# Patient Record
Sex: Male | Born: 1975 | Race: White | Hispanic: No | Marital: Married | Smoking: Never smoker
Health system: Southern US, Community
[De-identification: ages and names within clinical notes are randomized; demographics above are authoritative.]

## PROBLEM LIST (undated history)

## (undated) DIAGNOSIS — I499 Cardiac arrhythmia, unspecified: Secondary | ICD-10-CM

## (undated) DIAGNOSIS — I10 Essential (primary) hypertension: Secondary | ICD-10-CM

## (undated) DIAGNOSIS — I4510 Unspecified right bundle-branch block: Secondary | ICD-10-CM

---

## 2005-09-28 ENCOUNTER — Encounter: Payer: Self-pay | Admitting: Cardiovascular Disease

## 2005-09-28 ENCOUNTER — Ambulatory Visit: Payer: Self-pay

## 2007-06-28 ENCOUNTER — Ambulatory Visit (HOSPITAL_COMMUNITY): Admission: RE | Admit: 2007-06-28 | Discharge: 2007-06-28 | Payer: Self-pay | Admitting: Internal Medicine

## 2007-07-01 ENCOUNTER — Emergency Department: Payer: Self-pay | Admitting: Emergency Medicine

## 2007-07-25 ENCOUNTER — Ambulatory Visit: Payer: Self-pay | Admitting: Orthopaedic Surgery

## 2009-01-26 IMAGING — US US SOFT TISSUE EXCLUDE HEAD/NECK
1 series · 18 of 25 positions shown · non-contrast
Comparison: none

REASON FOR EXAM: ? mass on lateral thigh
COMMENTS:

PROCEDURE:     US  - US SOFT TISSUE, NOT NECK /  HEAD  - July 01, 2007  [DATE]
RESULT:     No cystic or solid masses are noted.

[Series 1: us soft tissue exclude head/neck · 18 of 37 slices shown]
[im 1/37]
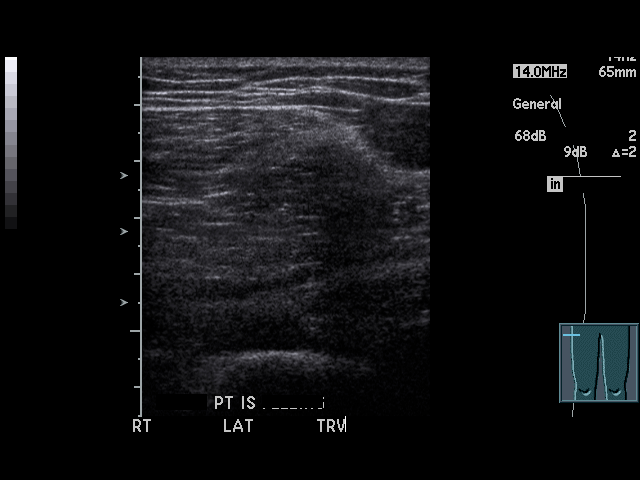
[im 4/37]
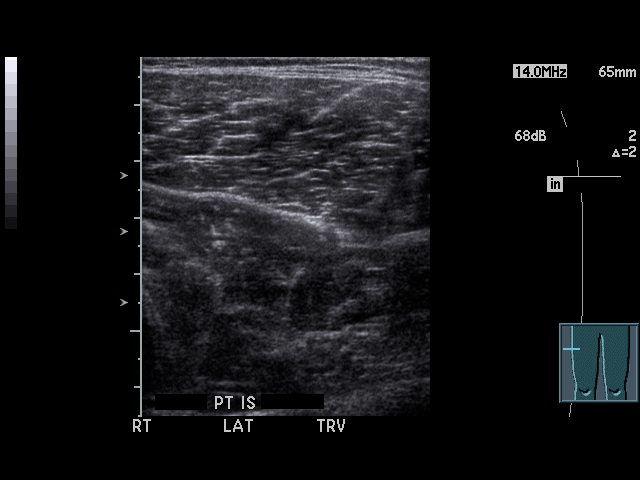
[im 5/37]
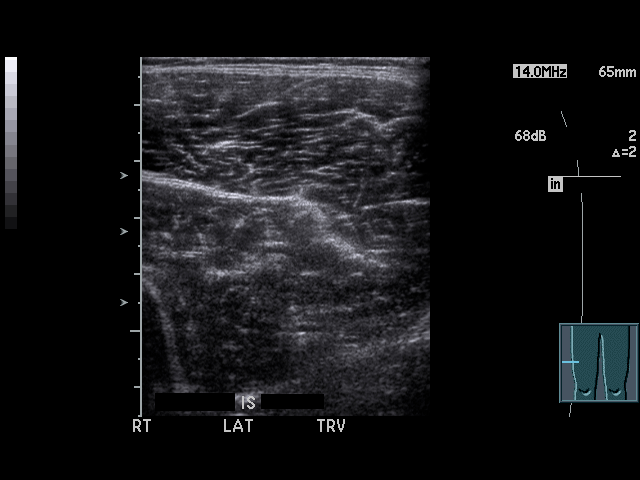
[im 7/37]
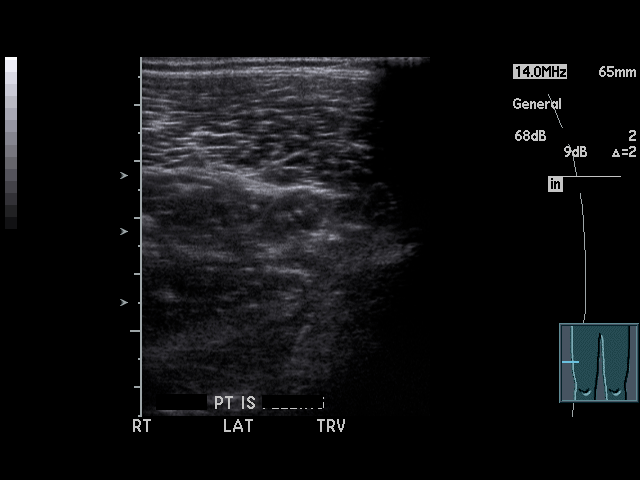
[im 10/37]
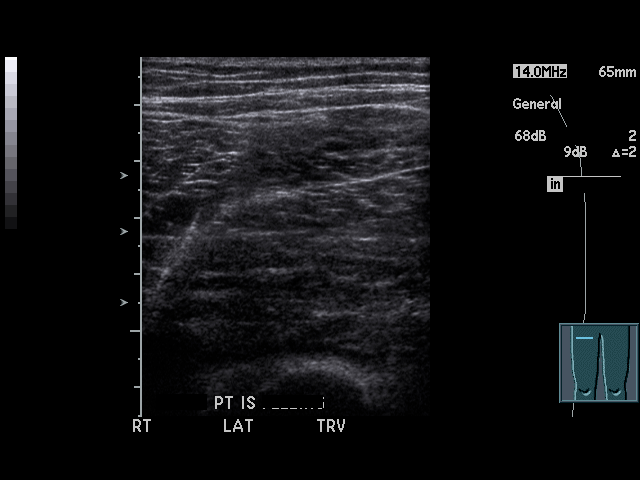
[im 11/37]
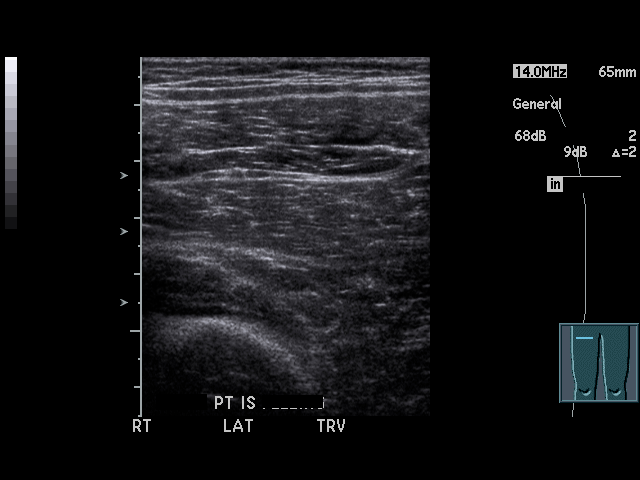
[im 14/37]
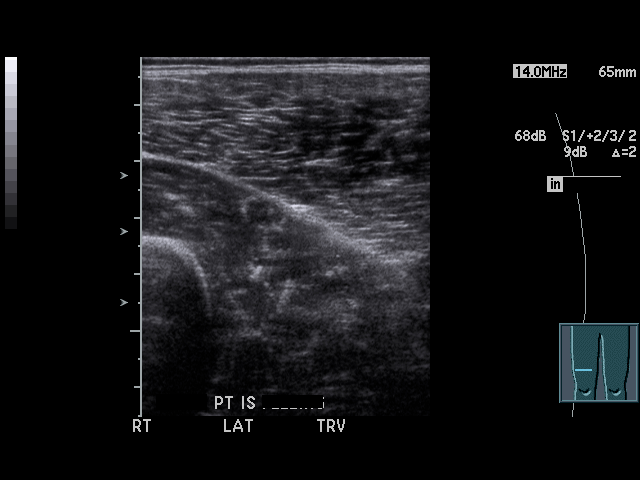
[im 16/37]
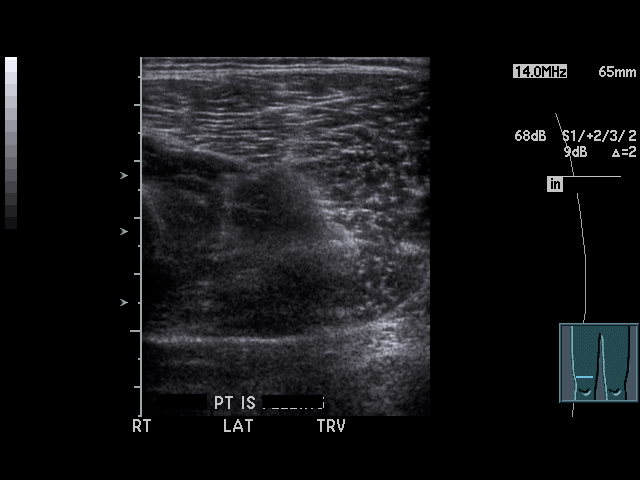
[im 17/37]
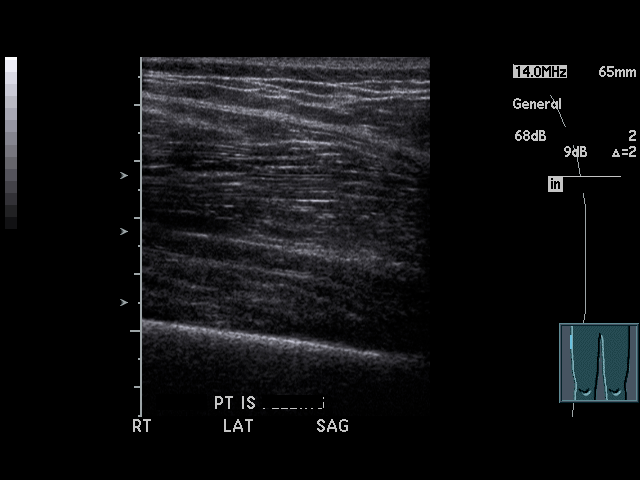
[im 20/37]
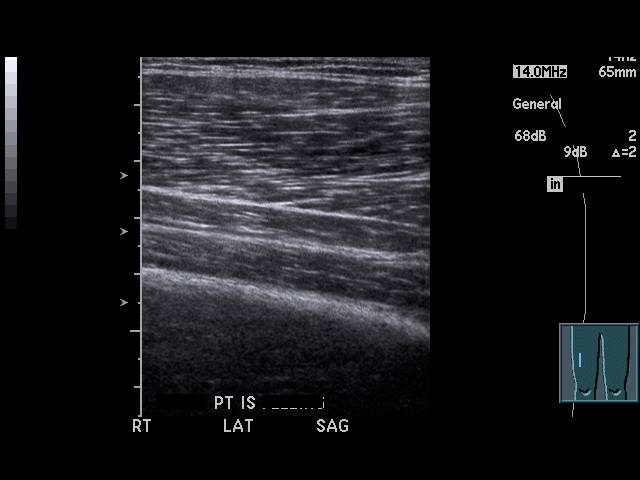
[im 22/37]
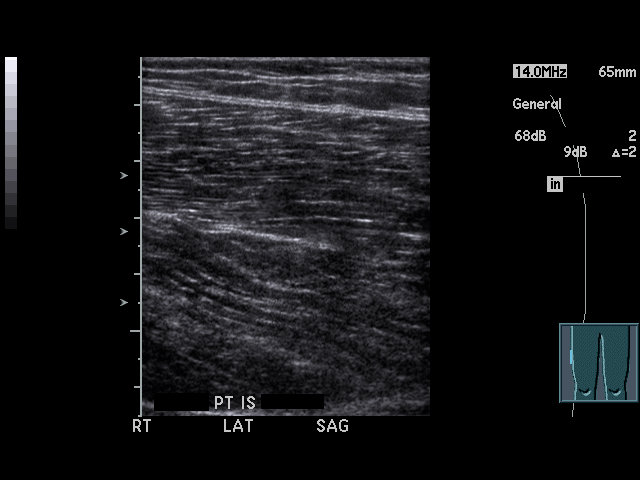
[im 23/37]
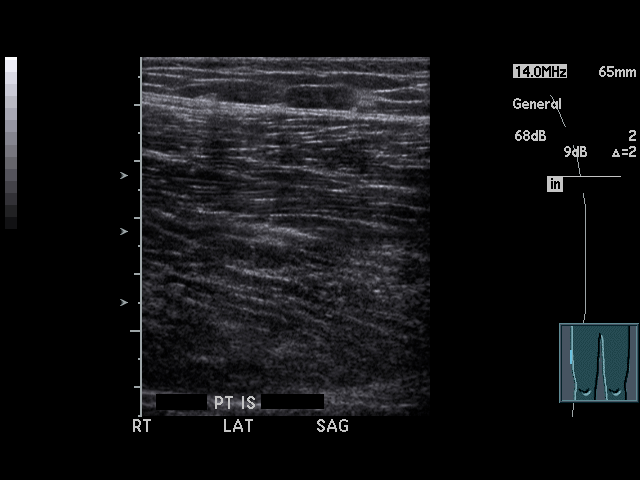
[im 26/37]
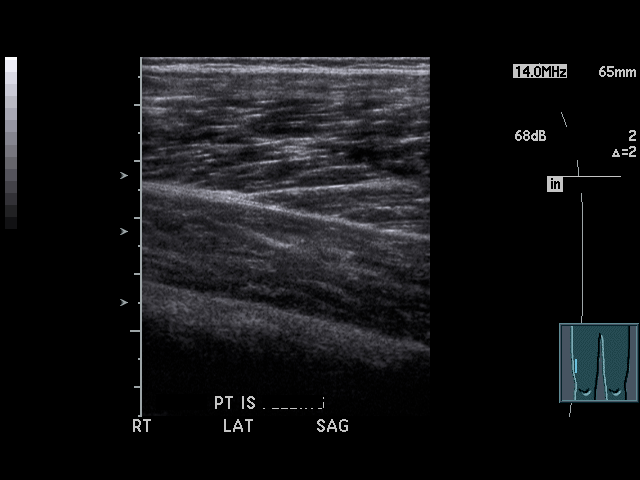
[im 28/37]
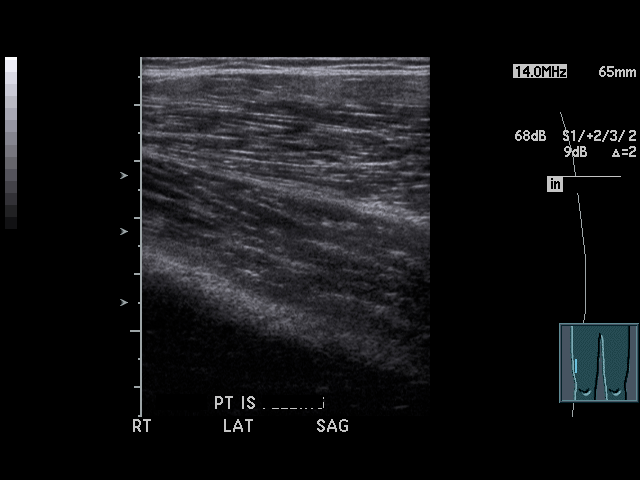
[im 31/37]
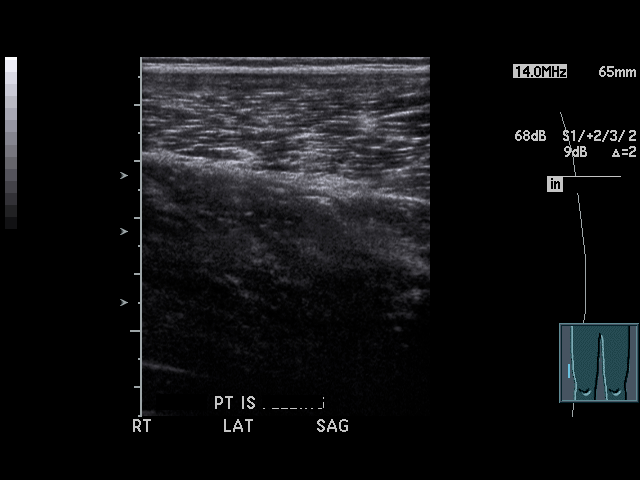
[im 32/37]
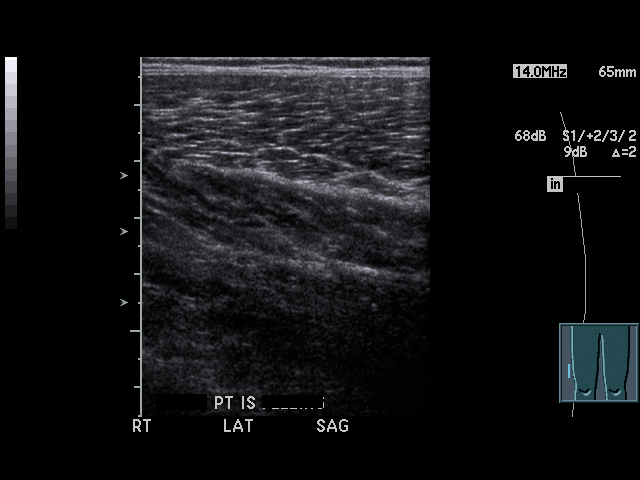
[im 34/37]
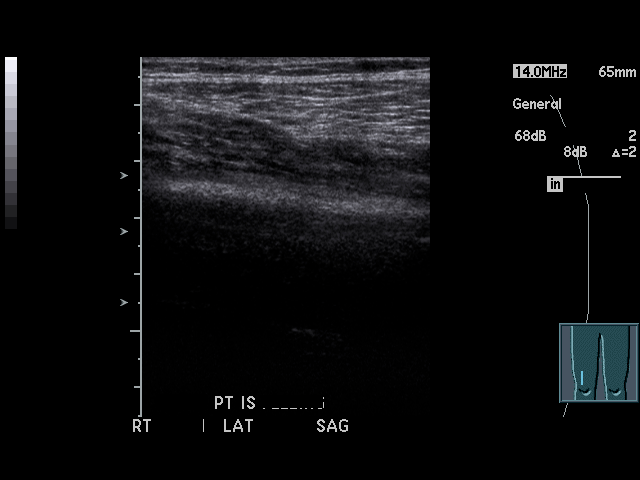
[im 37/37]
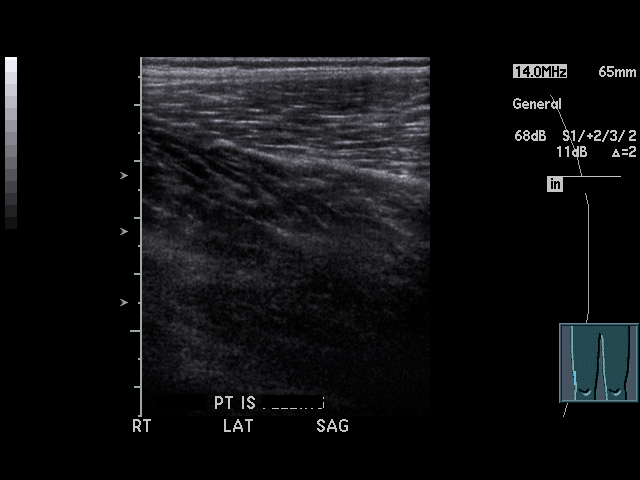

[18 of 25 positions shown; findings below may reference images not displayed]

IMPRESSION: 1)Negative exam. No evidence of mass lesion.

This report was discussed with the patient's physician.

## 2009-01-29 IMAGING — US US PELVIS COMPLETE
1 series · 14 of 23 positions shown · non-contrast
Comparison: NONE

CLINICAL DATA: Right lower extremity swelling and recent 
negative  vascular ultrasound. 

PELVIC ULTRASOUND

[Series 1: us pelvic · 0.33mm/px · 14 of 23 slices shown]
[im 1/23]
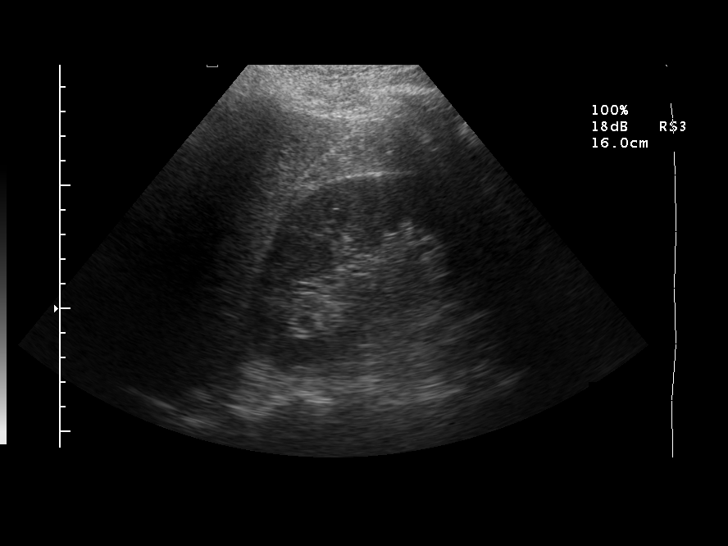
[im 3/23]
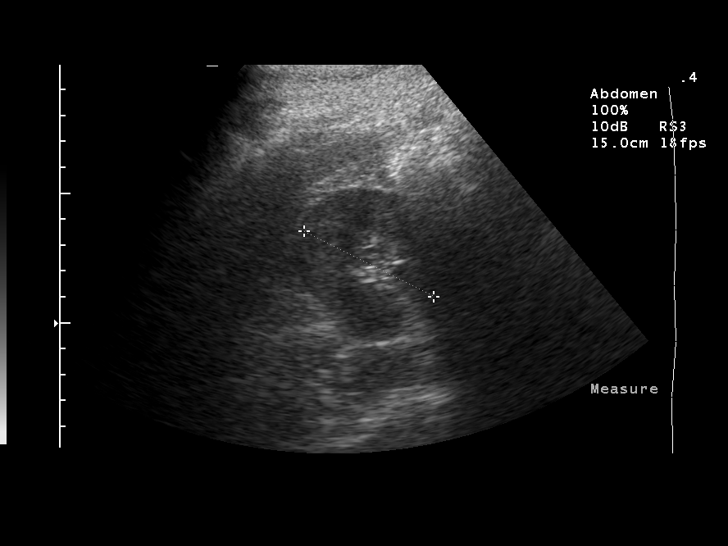
[im 5/23]
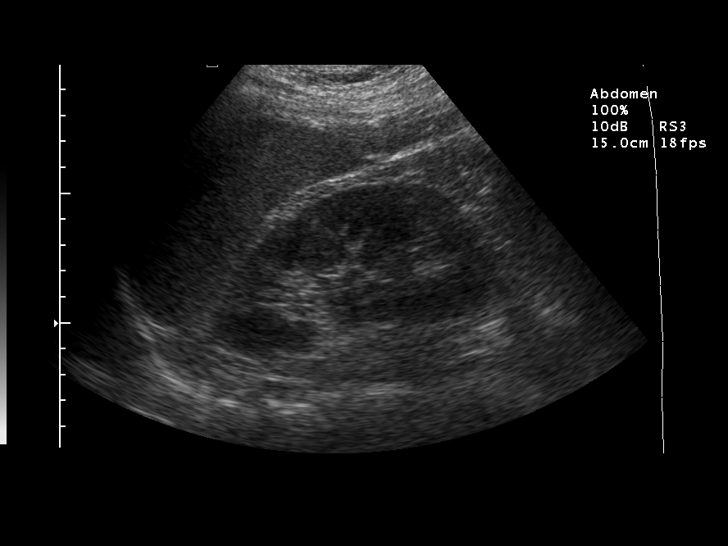
[im 6/23]
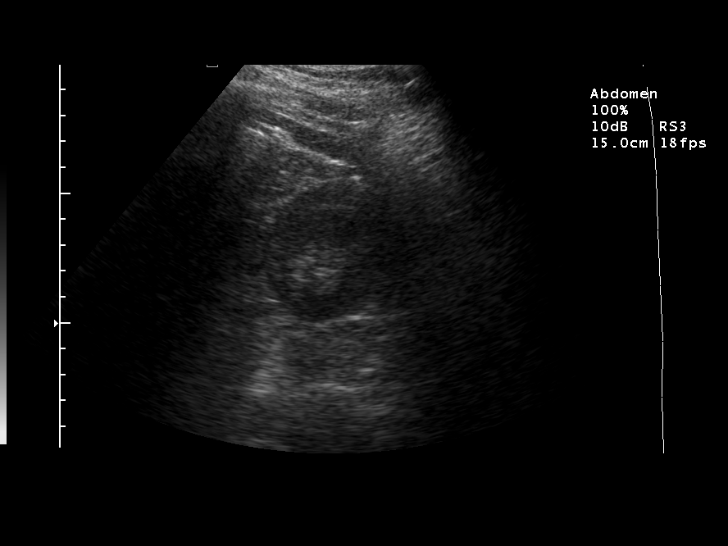
[im 8/23]
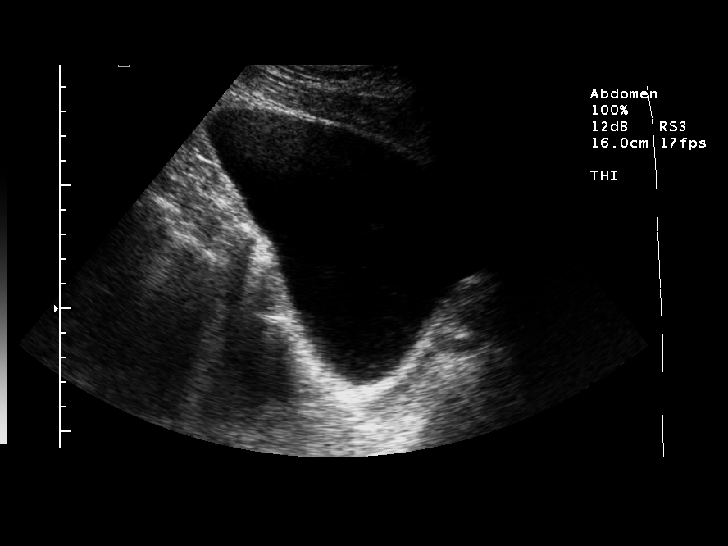
[im 10/23]
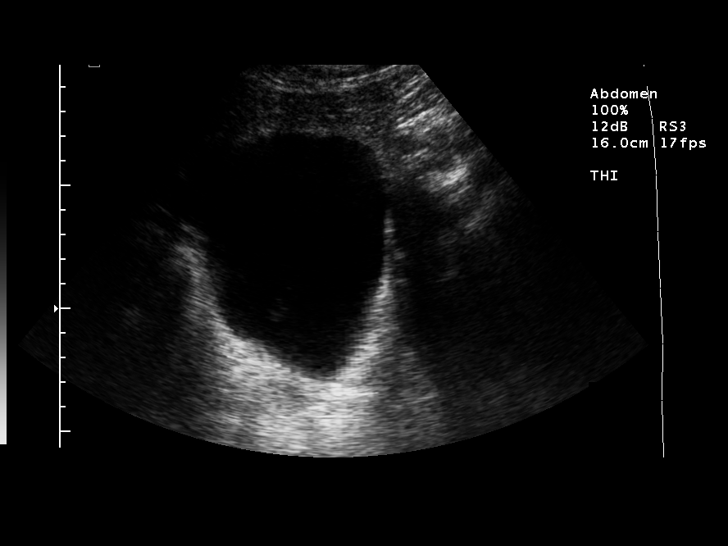
[im 11/23]
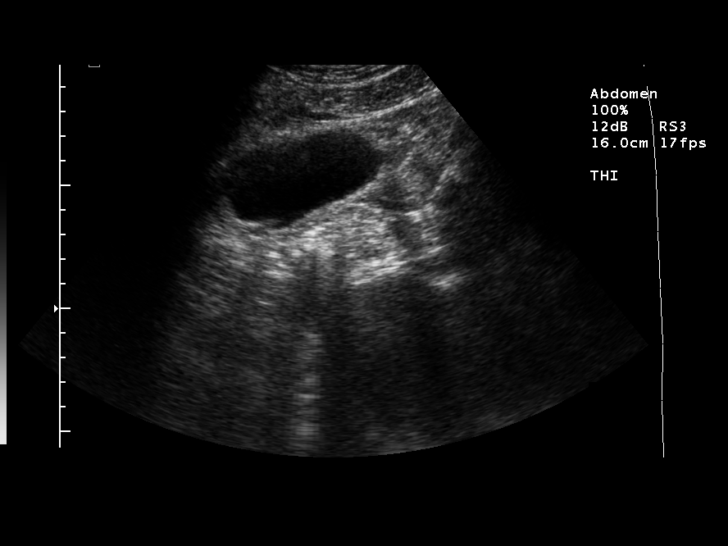
[im 13/23]
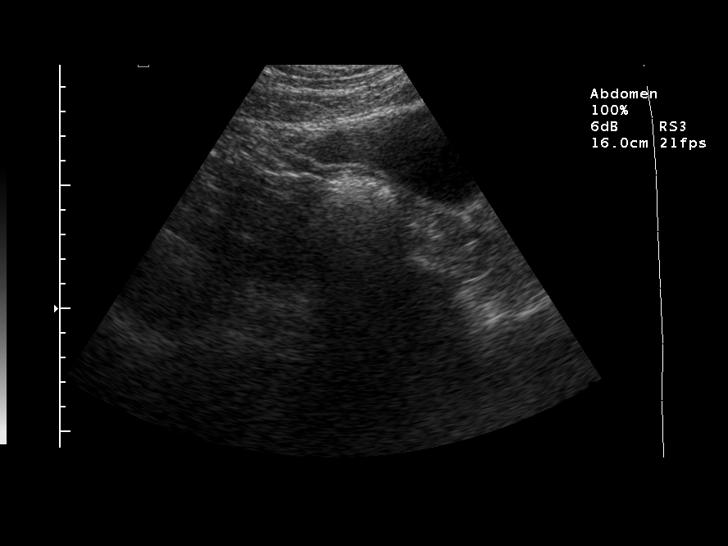
[im 14/23]
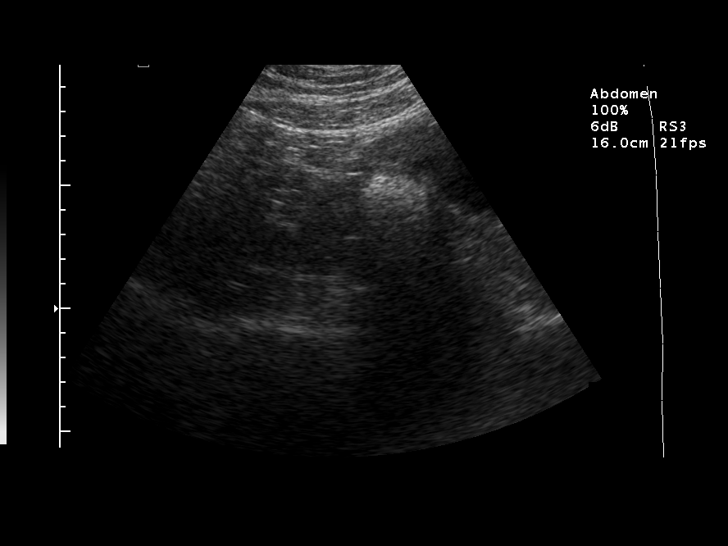
[im 16/23]
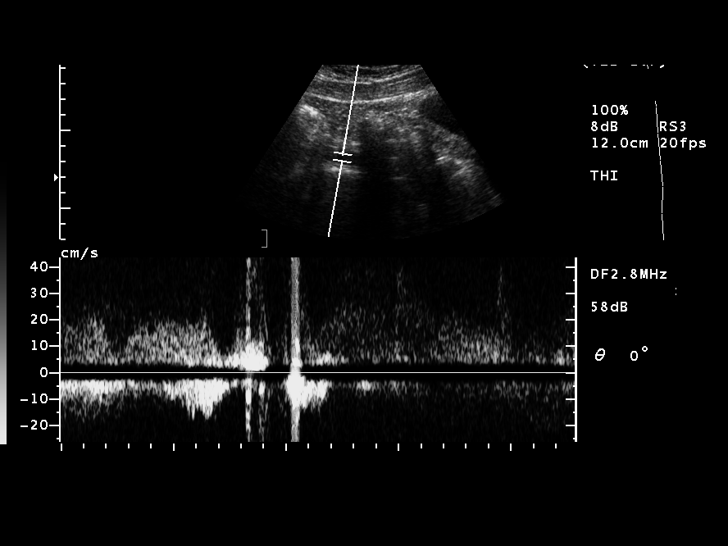
[im 18/23]
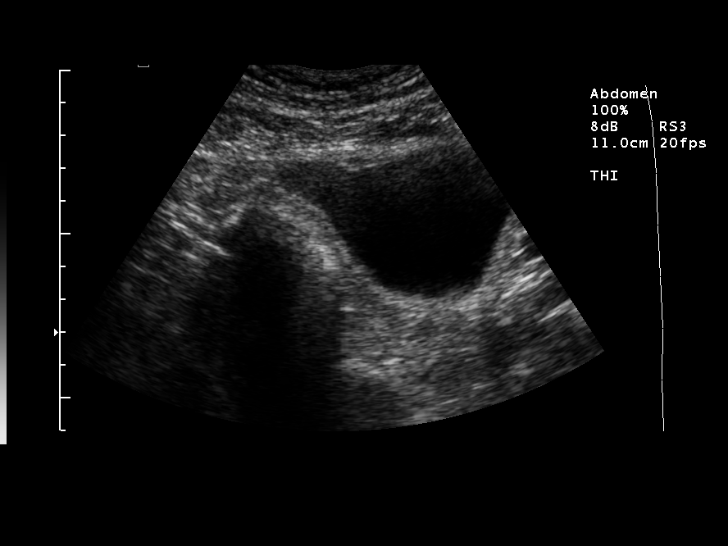
[im 19/23]
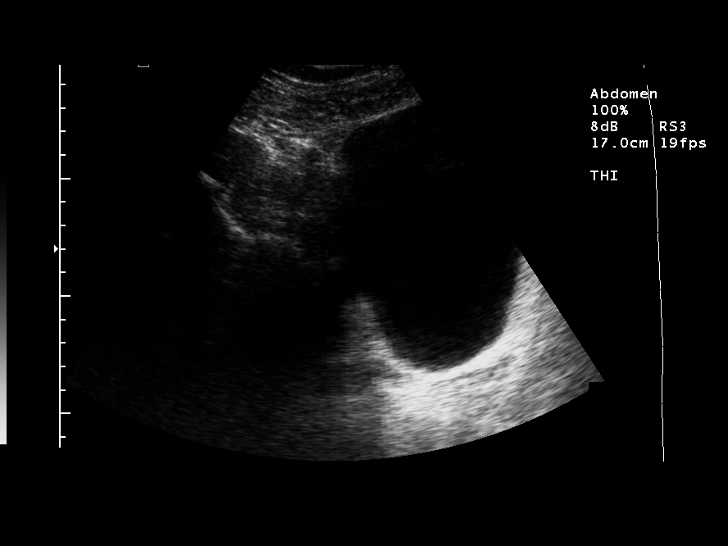
[im 21/23]
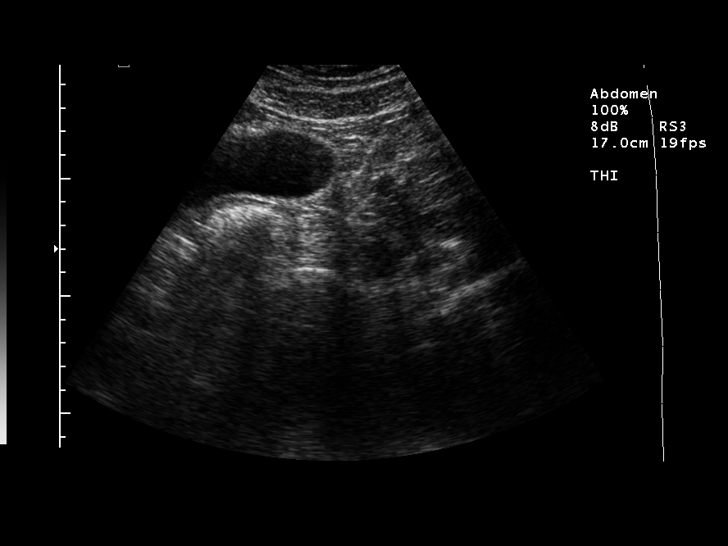
[im 23/23]
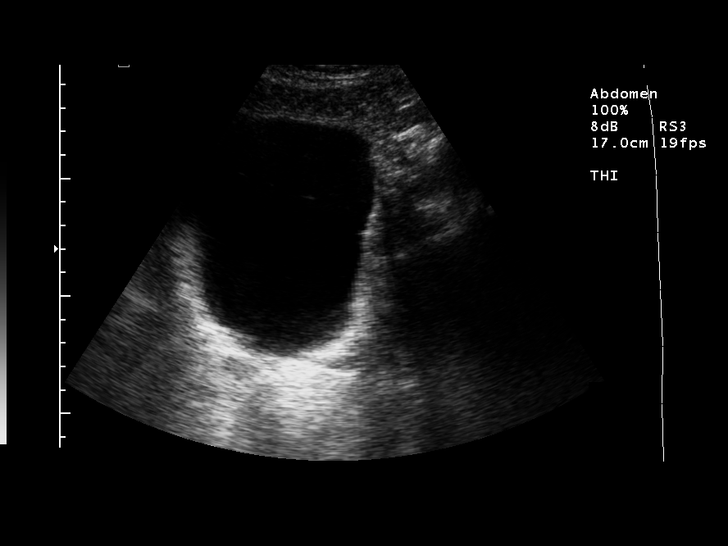

[14 of 23 positions shown; findings below may reference images not displayed]

FINDINGS: The kidneys are normal in size and echogenicity. There 
is no hydronephrosis or nephrolithiasis. The distended bladder was 
used as an acoustic window. No evidence of gross pelvic masses.
IMPRESSION: No gross pelvic masses are seen; however, the deep 
structures are partially obscured by overlying bowel gas. If 
clinically indicated, a CT of the pelvis can be performed with 
intravenous contrast for further evaluation. This would also be 
useful to assess for possible thrombus in the right iliac vein. 
07/04/2007  Trans Date: 07/05/2007 JH  [REDACTED]

## 2022-05-08 ENCOUNTER — Other Ambulatory Visit: Payer: Self-pay

## 2022-05-08 ENCOUNTER — Emergency Department
Admission: EM | Admit: 2022-05-08 | Discharge: 2022-05-08 | Disposition: A | Discharge status: Home / Self Care | Payer: 59 | Attending: Emergency Medicine | Admitting: Emergency Medicine

## 2022-05-08 ENCOUNTER — Encounter: Payer: Self-pay | Admitting: Emergency Medicine

## 2022-05-08 DIAGNOSIS — R21 Rash and other nonspecific skin eruption: Secondary | ICD-10-CM | POA: Diagnosis present

## 2022-05-08 DIAGNOSIS — T63484A Toxic effect of venom of other arthropod, undetermined, initial encounter: Secondary | ICD-10-CM

## 2022-05-08 DIAGNOSIS — T7840XA Allergy, unspecified, initial encounter: Secondary | ICD-10-CM

## 2022-05-08 DIAGNOSIS — W57XXXA Bitten or stung by nonvenomous insect and other nonvenomous arthropods, initial encounter: Secondary | ICD-10-CM | POA: Diagnosis not present

## 2022-05-08 HISTORY — DX: Essential (primary) hypertension: I10

## 2022-05-08 MED ORDER — FAMOTIDINE 20 MG PO TABS
40.0000 mg | ORAL_TABLET | Freq: Once | ORAL | Status: AC
  Administered 2022-05-08: 40 mg via ORAL
  Filled 2022-05-08: qty 2

## 2022-05-08 MED ORDER — PREDNISONE 50 MG PO TABS: 50.0000 mg | ORAL_TABLET | Freq: Every day | ORAL | 0 refills | Status: AC

## 2022-05-08 MED ORDER — EPINEPHRINE 0.3 MG/0.3ML IJ SOAJ: 0.3000 mg | INTRAMUSCULAR | 1 refills | Status: AC | PRN

## 2022-05-08 MED ORDER — PREDNISONE 20 MG PO TABS
40.0000 mg | ORAL_TABLET | Freq: Once | ORAL | Status: AC
  Administered 2022-05-08: 40 mg via ORAL
  Filled 2022-05-08: qty 2

## 2022-05-08 NOTE — ED Provider Notes (Signed)
[facility] Provider Note    Event Date/Time   First MD Initiated Contact with Patient 05/08/22 1219     (approximate)   History   Insect Bite and Allergic Reaction   HPI  Kristopher Duarte is a 46 y.o. male here with rash.  The patient was stung by 2 yellow jackets approximately 1 hour ago.  He has a history of a fairly severe reaction to this several years ago and was told that he would react likely more severe in the future if he got bit again.  He took Benadryl immediately after the sting and presents for evaluation.  He does note that he had sparse urticaria throughout his entire body, as well as some transient swelling in his right armpit, but no lip or tongue swelling.  No wheezing.  No abdominal pain, nausea, vomiting, diarrhea.  No abdominal cramping.  No other complaints.  No history of full-blown anaphylaxis, but has had a similar reaction in the past, though this was actually more severe according to his report.  He did not, however, at that time take Benadryl so quickly.  No other recent exposures, tick bites, or triggers.     Physical Exam   Triage Vital Signs: ED Triage Vitals  Enc Vitals Group     BP 05/08/22 1212 (!) 143/91     Pulse Rate 05/08/22 1212 73     Resp 05/08/22 1212 20     Temp 05/08/22 1212 98.6 F (37 C)     Temp Source 05/08/22 1212 Oral     SpO2 05/08/22 1212 95 %     Weight 05/08/22 1211 220 lb (99.8 kg)     Height 05/08/22 1211 5\' 6"  (1.676 m)     Head Circumference --      Peak Flow --      Pain Score 05/08/22 1212 6     Pain Loc --      Pain Edu? --      Excl. in GC? --     Most recent vital signs: Vitals:   05/08/22 1212  BP: (!) 143/91  Pulse: 73  Resp: 20  Temp: 98.6 F (37 C)  SpO2: 95%     General: Awake, no distress.  CV:  Good peripheral perfusion.   Resp:  Normal effort.  Abd:  No distention.  No tenderness. Other:  Scattered, sparse, urticarial rash.  2 sting marks to the bilateral ankles, no  significant induration.  No tongue swelling or lip swelling.   ED Results / Procedures / Treatments   Labs (all labs ordered are listed, but only abnormal results are displayed) Labs Reviewed - No data to display   EKG    RADIOLOGY    I also independently reviewed and agree with radiologist interpretations.   PROCEDURES:  Critical Care performed: No   MEDICATIONS ORDERED IN ED: Medications  predniSONE (DELTASONE) tablet 40 mg (40 mg Oral Given 05/08/22 1302)  famotidine (PEPCID) tablet 40 mg (40 mg Oral Given 05/08/22 1302)     IMPRESSION / MDM / ASSESSMENT AND PLAN / ED COURSE  I reviewed the triage vital signs and the nursing notes.                               Ddx:  Differential includes the following, with pertinent life- or limb-threatening emergencies considered:  Systemic allergic reaction, no signs of anaphylaxis or second system involvement, other  exposure/dermatitis, viral urticaria, vasculitis  Patient's presentation is most consistent with acute presentation with potential threat to life or bodily function.  MDM:  46 year old male here with yellowjacket stings to the bilateral ankles with diffuse urticaria but no second system involvement.  Patient has a history of an exaggerated systemic response to stings in the past.  He is now over 1 hour after exposure with no evidence of ongoing anaphylaxis.  He is hemodynamically stable.  Given that he did have a systemic reaction with a history of this, will give prednisone and Pepcid.  We will give him an EpiPen to have at home.  Will give short course of steroids at home.  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  Stable for discharge home.   MEDICATIONS GIVEN IN ED: Medications  predniSONE (DELTASONE) tablet 40 mg (40 mg Oral Given 05/08/22 1302)  famotidine (PEPCID) tablet 40 mg (40 mg Oral Given 05/08/22 1302)     Consults:     EMR  reviewed       FINAL CLINICAL IMPRESSION(S) / ED DIAGNOSES   Final diagnoses:  Allergic reaction, initial encounter  Insect stings, undetermined intent, initial encounter     Rx / DC Orders   ED Discharge Orders     None        Note:  This document was prepared using Dragon voice recognition software and may include unintentional dictation errors.   Shaune Pollack, MD 05/08/22 1308

## 2022-05-08 NOTE — Discharge Instructions (Signed)
Take Benadryl 25 mg every 6 hours for 2-3 days Take Pepcid 20 mg twice a day for 3 days Take the Prednisone for 3 days  Keep an Epipen on you and at home.

## 2022-05-08 NOTE — ED Triage Notes (Signed)
Pt via POV from home. Pt was stung by twice by bees on his bilateral ankles. States he started to develop hives after. Pt took Bendadryl 20 mins ago. Denies any SOB/throat swelling. Pt is A&Ox4 and NAD

## 2022-05-08 NOTE — ED Notes (Signed)
See triage note. Pt has isolated hives to both legs and R neck. Pt was stung by yellow jackets this morning to both ankles. Pt also has swollen area to R axilla since stung. Took PO benadryl on way to ER. No airway swelling noted.

## 2022-10-28 NOTE — H&P (Signed)
Pre-Procedure H&P   Patient ID: Kristopher Duarte is a 47 y.o. male.  Gastroenterology Provider: Annamaria Helling, DO  Referring Provider: Dr. Lovie Macadamia PCP: Juluis Pitch, MD  Date: 10/29/2022  HPI Mr. Kristopher Duarte is a 47 y.o. male who presents today for Colonoscopy for Initial screening colonoscopy .  Patient doing well overall from a GI perspective.  He had issues with left upper quadrant pain and constipation that resolved with MiraLAX use and daily BMs.  Bowels have been moving regularly since he was seen in the office in October.  Weight and appetite have been stable.  No melena or hematochezia.  Hemoglobin 14.8 MCV 89.5 platelets 253,000.  No family history of colon cancer or colon polyps   Past Medical History:  Diagnosis Date   Dysrhythmia    Hypertension    RBBB     History reviewed. No pertinent surgical history.  Family History Mother-diverticulosis No other h/o GI disease or malignancy  Review of Systems  Constitutional:  Negative for activity change, appetite change, chills, diaphoresis, fatigue, fever and unexpected weight change.  HENT:  Negative for trouble swallowing and voice change.   Respiratory:  Negative for shortness of breath and wheezing.   Cardiovascular:  Negative for chest pain, palpitations and leg swelling.  Gastrointestinal:  Negative for abdominal distention, abdominal pain, anal bleeding, blood in stool, constipation, diarrhea, nausea and vomiting.  Musculoskeletal:  Negative for arthralgias and myalgias.  Skin:  Negative for color change and pallor.  Neurological:  Negative for dizziness, syncope and weakness.  Psychiatric/Behavioral:  Negative for confusion. The patient is not nervous/anxious.   All other systems reviewed and are negative.    Medications No current facility-administered medications on file prior to encounter.   Current Outpatient Medications on File Prior to Encounter  Medication Sig Dispense Refill    lisinopril (ZESTRIL) 20 MG tablet Take 20 mg by mouth daily.     EPINEPHrine 0.3 mg/0.3 mL IJ SOAJ injection Inject 0.3 mg into the muscle as needed for anaphylaxis. 2 each 1    Pertinent medications related to GI and procedure were reviewed by me with the patient prior to the procedure   Current Facility-Administered Medications:    0.9 %  sodium chloride infusion, , Intravenous, Continuous, Annamaria Helling, DO      Allergies  Allergen Reactions   Bee Venom Hives   Allergies were reviewed by me prior to the procedure  Objective   Body mass index is 33.9 kg/m. Vitals:   10/29/22 0707  BP: (!) 141/57  Pulse: 68  Resp: 18  Temp: (!) 97.2 F (36.2 C)  TempSrc: Temporal  SpO2: 100%  Weight: 98.2 kg  Height: 5\' 7"  (1.702 m)     Physical Exam Vitals and nursing note reviewed.  Constitutional:      General: He is not in acute distress.    Appearance: Normal appearance. He is not ill-appearing, toxic-appearing or diaphoretic.  HENT:     Head: Normocephalic and atraumatic.     Nose: Nose normal.     Mouth/Throat:     Mouth: Mucous membranes are moist.     Pharynx: Oropharynx is clear.  Eyes:     General: No scleral icterus.    Extraocular Movements: Extraocular movements intact.  Cardiovascular:     Rate and Rhythm: Normal rate and regular rhythm.     Heart sounds: Normal heart sounds. No murmur heard.    No friction rub. No gallop.  Pulmonary:  Effort: Pulmonary effort is normal. No respiratory distress.     Breath sounds: Normal breath sounds. No wheezing, rhonchi or rales.  Abdominal:     General: Bowel sounds are normal. There is no distension.     Palpations: Abdomen is soft.     Tenderness: There is no abdominal tenderness. There is no guarding or rebound.  Musculoskeletal:     Cervical back: Neck supple.     Right lower leg: No edema.     Left lower leg: No edema.  Skin:    General: Skin is warm and dry.     Coloration: Skin is not jaundiced  or pale.  Neurological:     General: No focal deficit present.     Mental Status: He is alert and oriented to person, place, and time. Mental status is at baseline.  Psychiatric:        Mood and Affect: Mood normal.        Behavior: Behavior normal.        Thought Content: Thought content normal.        Judgment: Judgment normal.      Assessment:  Kristopher Duarte is a 47 y.o. male  who presents today for Colonoscopy for Initial screening colonoscopy .  Plan:  Colonoscopy with possible intervention today  Colonoscopy with possible biopsy, control of bleeding, polypectomy, and interventions as necessary has been discussed with the patient/patient representative. Informed consent was obtained from the patient/patient representative after explaining the indication, nature, and risks of the procedure including but not limited to death, bleeding, perforation, missed neoplasm/lesions, cardiorespiratory compromise, and reaction to medications. Opportunity for questions was given and appropriate answers were provided. Patient/patient representative has verbalized understanding is amenable to undergoing the procedure.   Annamaria Helling, DO  [facility] Gastroenterology  Portions of the record may have been created with voice recognition software. Occasional wrong-word or 'sound-a-like' substitutions may have occurred due to the inherent limitations of voice recognition software.  Read the chart carefully and recognize, using context, where substitutions may have occurred.

## 2022-10-29 ENCOUNTER — Encounter
Admission: RE | Disposition: A | Discharge status: Home / Self Care | Payer: Self-pay | Source: Home / Self Care | Attending: Gastroenterology

## 2022-10-29 ENCOUNTER — Ambulatory Visit: Payer: 59 | Admitting: Certified Registered"

## 2022-10-29 ENCOUNTER — Encounter: Payer: Self-pay | Admitting: Gastroenterology

## 2022-10-29 ENCOUNTER — Ambulatory Visit
Admission: RE | Admit: 2022-10-29 | Discharge: 2022-10-29 | Disposition: A | Discharge status: Home / Self Care | Payer: 59 | Attending: Gastroenterology | Admitting: Gastroenterology

## 2022-10-29 DIAGNOSIS — K635 Polyp of colon: Secondary | ICD-10-CM | POA: Diagnosis not present

## 2022-10-29 DIAGNOSIS — I10 Essential (primary) hypertension: Secondary | ICD-10-CM | POA: Diagnosis not present

## 2022-10-29 DIAGNOSIS — Z1211 Encounter for screening for malignant neoplasm of colon: Secondary | ICD-10-CM | POA: Insufficient documentation

## 2022-10-29 HISTORY — DX: Unspecified right bundle-branch block: I45.10

## 2022-10-29 HISTORY — PX: COLONOSCOPY WITH PROPOFOL: SHX5780

## 2022-10-29 HISTORY — DX: Cardiac arrhythmia, unspecified: I49.9

## 2022-10-29 SURGERY — COLONOSCOPY WITH PROPOFOL
Anesthesia: General

## 2022-10-29 MED ORDER — DEXMEDETOMIDINE HCL IN NACL 200 MCG/50ML IV SOLN
INTRAVENOUS | Status: DC | PRN
  Administered 2022-10-29: 8 ug via INTRAVENOUS

## 2022-10-29 MED ORDER — SODIUM CHLORIDE 0.9 % IV SOLN
INTRAVENOUS | Status: DC
  Administered 2022-10-29: 1000 mL via INTRAVENOUS

## 2022-10-29 MED ORDER — DEXMEDETOMIDINE HCL IN NACL 80 MCG/20ML IV SOLN
INTRAVENOUS | Status: AC
  Filled 2022-10-29: qty 20

## 2022-10-29 MED ORDER — LIDOCAINE HCL (CARDIAC) PF 100 MG/5ML IV SOSY
PREFILLED_SYRINGE | INTRAVENOUS | Status: DC | PRN
  Administered 2022-10-29: 100 mg via INTRAVENOUS

## 2022-10-29 MED ORDER — PROPOFOL 500 MG/50ML IV EMUL
INTRAVENOUS | Status: DC | PRN
  Administered 2022-10-29: 200 ug/kg/min via INTRAVENOUS
  Administered 2022-10-29: 100 mg via INTRAVENOUS

## 2022-10-29 MED ORDER — PROPOFOL 1000 MG/100ML IV EMUL
INTRAVENOUS | Status: AC
  Filled 2022-10-29: qty 200

## 2022-10-29 NOTE — Anesthesia Postprocedure Evaluation (Signed)
Anesthesia Post Note  Patient: [name]  Procedure(s) Performed: COLONOSCOPY WITH PROPOFOL  Patient location during evaluation: Endoscopy Anesthesia Type: General Level of consciousness: awake and alert Pain management: pain level controlled Vital Signs Assessment: post-procedure vital signs reviewed and stable Respiratory status: spontaneous breathing, nonlabored ventilation, respiratory function stable and patient connected to nasal cannula oxygen Cardiovascular status: blood pressure returned to baseline and stable Postop Assessment: no apparent nausea or vomiting Anesthetic complications: no   There were no known notable events for this encounter.   Last Vitals:  Vitals:   10/29/22 0754 10/29/22 0801  BP: 103/73 104/84  Pulse:    Resp:    Temp: (!) 36.1 C   SpO2:      Last Pain:  Vitals:   10/29/22 0801  TempSrc:   PainSc: 0-No pain                 Precious Haws Elijan Googe

## 2022-10-29 NOTE — Transfer of Care (Signed)
Immediate Anesthesia Transfer of Care Note  Patient: Kristopher Duarte  Procedure(s) Performed: COLONOSCOPY WITH PROPOFOL  Patient Location: PACU  Anesthesia Type:General  Level of Consciousness: awake, alert , and oriented  Airway & Oxygen Therapy: Patient Spontanous Breathing  Post-op Assessment: Report given to RN and Post -op Vital signs reviewed and stable  Post vital signs: stable  Last Vitals:  Vitals Value Taken Time  BP 103/73 10/29/22 0754  Temp    Pulse 75 10/29/22 0755  Resp 15 10/29/22 0755  SpO2 97 % 10/29/22 0755  Vitals shown include unvalidated device data.  Last Pain:  Vitals:   10/29/22 0707  TempSrc: Temporal         Complications: No notable events documented.

## 2022-10-29 NOTE — Addendum Note (Signed)
Addendum  created 10/29/22 0913 by Saory Carriero, Precious Haws, MD   Intraprocedure Staff edited

## 2022-10-29 NOTE — Interval H&P Note (Signed)
History and Physical Interval Note: Preprocedure H&P from 10/29/22  was reviewed and there was no interval change after seeing and examining the patient.  Written consent was obtained from the patient after discussion of risks, benefits, and alternatives. Patient has consented to proceed with Colonoscopy with possible intervention   10/29/2022 7:23 AM  Kristopher Duarte  has presented today for surgery, with the diagnosis of Z12.11 - Encounter for screening colonoscopy for non-high-risk patient.  The various methods of treatment have been discussed with the patient and family. After consideration of risks, benefits and other options for treatment, the patient has consented to  Procedure(s): COLONOSCOPY WITH PROPOFOL (N/A) as a surgical intervention.  The patient's history has been reviewed, patient examined, no change in status, stable for surgery.  I have reviewed the patient's chart and labs.  Questions were answered to the patient's satisfaction.     Annamaria Helling

## 2022-10-29 NOTE — Anesthesia Preprocedure Evaluation (Signed)
Anesthesia Evaluation  Patient identified by MRN, date of birth, ID band Patient awake    Reviewed: Allergy & Precautions, NPO status , Patient's Chart, lab work & pertinent test results  History of Anesthesia Complications Negative for: history of anesthetic complications  Airway Mallampati: III  TM Distance: <3 FB Neck ROM: full    Dental  (+) Chipped   Pulmonary neg pulmonary ROS, neg shortness of breath   Pulmonary exam normal        Cardiovascular Exercise Tolerance: Good hypertension, Normal cardiovascular exam+ dysrhythmias      Neuro/Psych negative neurological ROS  negative psych ROS   GI/Hepatic negative GI ROS, Neg liver ROS,neg GERD  ,,  Endo/Other  negative endocrine ROS    Renal/GU negative Renal ROS  negative genitourinary   Musculoskeletal   Abdominal   Peds  Hematology negative hematology ROS (+)   Anesthesia Other Findings Past Medical History: No date: Dysrhythmia No date: Hypertension No date: RBBB  History reviewed. No pertinent surgical history.  BMI    Body Mass Index: 33.90 kg/m      Reproductive/Obstetrics negative OB ROS                             Anesthesia Physical Anesthesia Plan  ASA: 2  Anesthesia Plan: General   Post-op Pain Management:    Induction: Intravenous  PONV Risk Score and Plan: Propofol infusion and TIVA  Airway Management Planned: Natural Airway and Nasal Cannula  Additional Equipment:   Intra-op Plan:   Post-operative Plan:   Informed Consent: I have reviewed the patients History and Physical, chart, labs and discussed the procedure including the risks, benefits and alternatives for the proposed anesthesia with the patient or authorized representative who has indicated his/her understanding and acceptance.     Dental Advisory Given  Plan Discussed with: Anesthesiologist, CRNA and Surgeon  Anesthesia Plan  Comments: (Patient consented for risks of anesthesia including but not limited to:  - adverse reactions to medications - risk of airway placement if required - damage to eyes, teeth, lips or other oral mucosa - nerve damage due to positioning  - sore throat or hoarseness - Damage to heart, brain, nerves, lungs, other parts of body or loss of life  Patient voiced understanding.)       Anesthesia Quick Evaluation

## 2022-10-29 NOTE — Op Note (Signed)
[facility] Gastroenterology Patient Name: Kristopher Duarte Procedure Date: 10/29/2022 7:25 AM MRN: [id number] Account #: [id number] Date of Birth: 11/22/1975 Admit Type: Outpatient Age: 47 Room: [facility] ENDO ROOM 1 Gender: Male Note Status: Finalized Instrument Name: Colonoscope [id number] Procedure:             Colonoscopy Indications:           Screening for colorectal malignant neoplasm Providers:             Rueben Bash, DO Referring MD:          Youlanda Roys. Lovie Macadamia, MD (Referring MD) Medicines:             Monitored Anesthesia Care Complications:         No immediate complications. Estimated blood loss:                         Minimal. Procedure:             Pre-Anesthesia Assessment:                        - Prior to the procedure, a History and Physical was                         performed, and patient medications and allergies were                         reviewed. The patient is competent. The risks and                         benefits of the procedure and the sedation options and                         risks were discussed with the patient. All questions                         were answered and informed consent was obtained.                         Patient identification and proposed procedure were                         verified by the physician, the nurse, the anesthetist                         and the technician in the endoscopy suite. Mental                         Status Examination: alert and oriented. Airway                         Examination: normal oropharyngeal airway and neck                         mobility. Respiratory Examination: clear to                         auscultation. CV Examination: RRR, no murmurs, no S3  or S4. Prophylactic Antibiotics: The patient does not                         require prophylactic antibiotics. Prior                         Anticoagulants: The patient has taken no anticoagulant                          or antiplatelet agents. ASA Grade Assessment: II - A                         patient with mild systemic disease. After reviewing                         the risks and benefits, the patient was deemed in                         satisfactory condition to undergo the procedure. The                         anesthesia plan was to use monitored anesthesia care                         (MAC). Immediately prior to administration of                         medications, the patient was re-assessed for adequacy                         to receive sedatives. The heart rate, respiratory                         rate, oxygen saturations, blood pressure, adequacy of                         pulmonary ventilation, and response to care were                         monitored throughout the procedure. The physical                         status of the patient was re-assessed after the                         procedure.                        After obtaining informed consent, the colonoscope was                         passed under direct vision. Throughout the procedure,                         the patient's blood pressure, pulse, and oxygen                         saturations were monitored continuously. The  Colonoscope was introduced through the anus and                         advanced to the the terminal ileum, with                         identification of the appendiceal orifice and IC                         valve. The colonoscopy was performed without                         difficulty. The patient tolerated the procedure well.                         The quality of the bowel preparation was evaluated                         using the BBPS [facility] Bowel Preparation Scale) with                         scores of: Right Colon = 2 (minor amount of residual                         staining, small fragments of stool and/or opaque                         liquid, but  mucosa seen well), Transverse Colon = 3                         (entire mucosa seen well with no residual staining,                         small fragments of stool or opaque liquid) and Left                         Colon = 3 (entire mucosa seen well with no residual                         staining, small fragments of stool or opaque liquid).                         The total BBPS score equals 8. The quality of the                         bowel preparation was excellent. The terminal ileum,                         ileocecal valve, appendiceal orifice, and rectum were                         photographed. Findings:      The perianal and digital rectal examinations were normal. Pertinent       negatives include normal sphincter tone.      The terminal ileum appeared normal. Estimated blood loss: none.      Retroflexion in the right colon was performed.  A 1 to 2 mm polyp was found in the sigmoid colon. The polyp was sessile.       The polyp was removed with a jumbo cold forceps. Resection and retrieval       were complete. Estimated blood loss was minimal.      The exam was otherwise without abnormality on direct and retroflexion       views. Impression:            - The examined portion of the ileum was normal.                        - One 1 to 2 mm polyp in the sigmoid colon, removed                         with a jumbo cold forceps. Resected and retrieved.                        - The examination was otherwise normal on direct and                         retroflexion views. Recommendation:        - Patient has a contact number available for                         emergencies. The signs and symptoms of potential                         delayed complications were discussed with the patient.                         Return to normal activities tomorrow. Written                         discharge instructions were provided to the patient.                        - Discharge patient to  home.                        - Resume previous diet.                        - Continue present medications.                        - Await pathology results.                        - Repeat colonoscopy for surveillance based on                         pathology results.                        - Return to referring physician as previously                         scheduled.                        - The  findings and recommendations were discussed with                         the patient. Procedure Code(s):     --- Professional ---                        [phone], Colonoscopy, flexible; with biopsy, single or                         multiple Diagnosis Code(s):     --- Professional ---                        Z12.11, Encounter for screening for malignant neoplasm                         of colon                        D12.5, Benign neoplasm of sigmoid colon CPT copyright 2022 [employer]. All rights reserved. The codes documented in this report are preliminary and upon coder review may  be revised to meet current compliance requirements. Attending Participation:      I personally performed the entire procedure. Volney American, DO Annamaria Helling DO, DO 10/29/2022 7:52:11 AM This report has been signed electronically. Number of Addenda: 0 Note Initiated On: 10/29/2022 7:25 AM Scope Withdrawal Time: 0 hours 14 minutes 27 seconds  Total Procedure Duration: 0 hours 17 minutes 48 seconds  Estimated Blood Loss:  Estimated blood loss was minimal.      [facility]

## 2022-11-01 ENCOUNTER — Encounter: Payer: Self-pay | Admitting: Gastroenterology

## 2022-11-01 LAB — SURGICAL PATHOLOGY
